# Patient Record
Sex: Female | Born: 1965 | Race: Black or African American | Hispanic: No | Marital: Single | Smoking: Current every day smoker
Health system: Southern US, Community
[De-identification: ages and names within clinical notes are randomized; demographics above are authoritative.]

## PROBLEM LIST (undated history)

## (undated) DIAGNOSIS — I10 Essential (primary) hypertension: Secondary | ICD-10-CM

## (undated) DIAGNOSIS — E559 Vitamin D deficiency, unspecified: Secondary | ICD-10-CM

## (undated) DIAGNOSIS — T7840XA Allergy, unspecified, initial encounter: Secondary | ICD-10-CM

## (undated) DIAGNOSIS — L930 Discoid lupus erythematosus: Secondary | ICD-10-CM

## (undated) HISTORY — DX: Vitamin D deficiency, unspecified: E55.9

## (undated) HISTORY — DX: Allergy, unspecified, initial encounter: T78.40XA

---

## 2005-04-23 ENCOUNTER — Ambulatory Visit: Payer: Self-pay | Admitting: Family Medicine

## 2009-01-12 ENCOUNTER — Ambulatory Visit: Payer: Self-pay | Admitting: Family Medicine

## 2009-03-16 ENCOUNTER — Ambulatory Visit: Payer: Self-pay | Admitting: Family Medicine

## 2009-04-19 ENCOUNTER — Ambulatory Visit: Payer: Self-pay | Admitting: Family Medicine

## 2010-03-01 ENCOUNTER — Ambulatory Visit: Payer: Self-pay | Admitting: Family Medicine

## 2011-01-28 ENCOUNTER — Ambulatory Visit: Payer: Self-pay | Admitting: Family Medicine

## 2012-02-13 ENCOUNTER — Ambulatory Visit: Payer: Self-pay | Admitting: Family Medicine

## 2013-02-15 ENCOUNTER — Ambulatory Visit: Payer: Self-pay | Admitting: Family Medicine

## 2014-02-15 ENCOUNTER — Ambulatory Visit: Payer: Self-pay | Admitting: Family Medicine

## 2015-02-21 ENCOUNTER — Ambulatory Visit
Admit: 2015-02-21 | Disposition: A | Discharge status: Home / Self Care | Payer: Self-pay | Attending: Family Medicine | Admitting: Family Medicine

## 2015-03-16 ENCOUNTER — Other Ambulatory Visit: Payer: Self-pay | Admitting: Family Medicine

## 2015-03-16 DIAGNOSIS — E049 Nontoxic goiter, unspecified: Secondary | ICD-10-CM

## 2015-03-22 ENCOUNTER — Ambulatory Visit
Admission: RE | Admit: 2015-03-22 | Discharge: 2015-03-22 | Disposition: A | Discharge status: Home / Self Care | Payer: BLUE CROSS/BLUE SHIELD | Source: Ambulatory Visit | Attending: Family Medicine | Admitting: Family Medicine

## 2015-03-22 DIAGNOSIS — E049 Nontoxic goiter, unspecified: Secondary | ICD-10-CM

## 2015-03-22 DIAGNOSIS — E042 Nontoxic multinodular goiter: Secondary | ICD-10-CM | POA: Insufficient documentation

## 2015-05-17 ENCOUNTER — Encounter: Payer: Self-pay | Admitting: *Deleted

## 2015-05-18 ENCOUNTER — Ambulatory Visit
Admission: RE | Admit: 2015-05-18 | Discharge: 2015-05-18 | Disposition: A | Discharge status: Home / Self Care | Payer: BLUE CROSS/BLUE SHIELD | Source: Ambulatory Visit | Attending: Gastroenterology | Admitting: Gastroenterology

## 2015-05-18 ENCOUNTER — Encounter
Admission: RE | Disposition: A | Discharge status: Home / Self Care | Payer: Self-pay | Source: Ambulatory Visit | Attending: Gastroenterology

## 2015-05-18 ENCOUNTER — Ambulatory Visit: Payer: BLUE CROSS/BLUE SHIELD | Admitting: Anesthesiology

## 2015-05-18 ENCOUNTER — Encounter: Payer: Self-pay | Admitting: Anesthesiology

## 2015-05-18 DIAGNOSIS — Z823 Family history of stroke: Secondary | ICD-10-CM | POA: Diagnosis not present

## 2015-05-18 DIAGNOSIS — Z8249 Family history of ischemic heart disease and other diseases of the circulatory system: Secondary | ICD-10-CM | POA: Insufficient documentation

## 2015-05-18 DIAGNOSIS — Z882 Allergy status to sulfonamides status: Secondary | ICD-10-CM | POA: Insufficient documentation

## 2015-05-18 DIAGNOSIS — Z6841 Body Mass Index (BMI) 40.0 and over, adult: Secondary | ICD-10-CM | POA: Insufficient documentation

## 2015-05-18 DIAGNOSIS — E669 Obesity, unspecified: Secondary | ICD-10-CM | POA: Diagnosis not present

## 2015-05-18 DIAGNOSIS — F17210 Nicotine dependence, cigarettes, uncomplicated: Secondary | ICD-10-CM | POA: Diagnosis not present

## 2015-05-18 DIAGNOSIS — Z833 Family history of diabetes mellitus: Secondary | ICD-10-CM | POA: Diagnosis not present

## 2015-05-18 DIAGNOSIS — I10 Essential (primary) hypertension: Secondary | ICD-10-CM | POA: Insufficient documentation

## 2015-05-18 DIAGNOSIS — Z1211 Encounter for screening for malignant neoplasm of colon: Secondary | ICD-10-CM | POA: Diagnosis present

## 2015-05-18 DIAGNOSIS — Z79899 Other long term (current) drug therapy: Secondary | ICD-10-CM | POA: Diagnosis not present

## 2015-05-18 DIAGNOSIS — L930 Discoid lupus erythematosus: Secondary | ICD-10-CM | POA: Insufficient documentation

## 2015-05-18 DIAGNOSIS — K640 First degree hemorrhoids: Secondary | ICD-10-CM | POA: Diagnosis not present

## 2015-05-18 HISTORY — DX: Discoid lupus erythematosus: L93.0

## 2015-05-18 HISTORY — DX: Essential (primary) hypertension: I10

## 2015-05-18 HISTORY — PX: COLONOSCOPY WITH PROPOFOL: SHX5780

## 2015-05-18 SURGERY — COLONOSCOPY WITH PROPOFOL
Anesthesia: General

## 2015-05-18 MED ORDER — MIDAZOLAM HCL 2 MG/2ML IJ SOLN
INTRAMUSCULAR | Status: DC | PRN
  Administered 2015-05-18: 2 mg via INTRAVENOUS

## 2015-05-18 MED ORDER — LIDOCAINE HCL (CARDIAC) 20 MG/ML IV SOLN
INTRAVENOUS | Status: DC | PRN
  Administered 2015-05-18: 80 mg via INTRAVENOUS

## 2015-05-18 MED ORDER — SODIUM CHLORIDE 0.9 % IV SOLN: INTRAVENOUS | Status: DC

## 2015-05-18 MED ORDER — SODIUM CHLORIDE 0.9 % IV SOLN
INTRAVENOUS | Status: DC
  Administered 2015-05-18: 1000 mL via INTRAVENOUS

## 2015-05-18 MED ORDER — PROPOFOL INFUSION 10 MG/ML OPTIME
INTRAVENOUS | Status: DC | PRN
  Administered 2015-05-18: 140 ug/kg/min via INTRAVENOUS

## 2015-05-18 MED ORDER — GLYCOPYRROLATE 0.2 MG/ML IJ SOLN
INTRAMUSCULAR | Status: DC | PRN
  Administered 2015-05-18: .2 mg via INTRAVENOUS

## 2015-05-18 NOTE — Anesthesia Postprocedure Evaluation (Signed)
  Anesthesia Post-op Note  Patient: Michele Hart  Procedure(s) Performed: Procedure(s): COLONOSCOPY WITH PROPOFOL (N/A)  Anesthesia type:General  Patient location: PACU  Post pain: Pain level controlled  Post assessment: Post-op Vital signs reviewed, Patient's Cardiovascular Status Stable, Respiratory Function Stable, Patent Airway and No signs of Nausea or vomiting  Post vital signs: Reviewed and stable  Last Vitals:  Filed Vitals:   05/18/15 0951  BP: 125/80  Pulse: 64  Temp:   Resp: 16    Level of consciousness: awake, alert  and patient cooperative  Complications: No apparent anesthesia complications

## 2015-05-18 NOTE — Anesthesia Preprocedure Evaluation (Signed)
Anesthesia Evaluation    Airway Mallampati: II  TM Distance: >3 FB     Dental  (+) Chipped   Pulmonary Current Smoker,          Cardiovascular hypertension,     Neuro/Psych    GI/Hepatic   Endo/Other    Renal/GU      Musculoskeletal   Abdominal   Peds  Hematology   Anesthesia Other Findings Pt. Is sure she is not pregnant. Takes plaquenil for SLE. Obesity.  Reproductive/Obstetrics                             Anesthesia Physical Anesthesia Plan  ASA: III  Anesthesia Plan: General   Post-op Pain Management:    Induction: Intravenous  Airway Management Planned: Nasal Cannula  Additional Equipment:   Intra-op Plan:   Post-operative Plan:   Informed Consent:   Plan Discussed with: CRNA  Anesthesia Plan Comments:         Anesthesia Quick Evaluation

## 2015-05-18 NOTE — Transfer of Care (Signed)
Immediate Anesthesia Transfer of Care Note  Patient: Michele Hart  Procedure(s) Performed: Procedure(s): COLONOSCOPY WITH PROPOFOL (N/A)  Patient Location: PACU and Endoscopy Unit  Anesthesia Type:General  Level of Consciousness: sedated  Airway & Oxygen Therapy: Patient Spontanous Breathing and Patient connected to nasal cannula oxygen  Post-op Assessment: Report given to RN and Post -op Vital signs reviewed and stable  Post vital signs: Reviewed and stable  Last Vitals:  Filed Vitals:   05/18/15 0921  BP: 93/60  Pulse: 77  Temp: 35.7 C  Resp: 16    Complications: No apparent anesthesia complications

## 2015-05-18 NOTE — Discharge Instructions (Signed)

## 2015-05-18 NOTE — H&P (Signed)
  Primary Care Physician:  Jerl MinaHEDRICK, JAMES, MD  Pre-Procedure History & Physical: HPI:  Michele Hart is a 49 y.o. female is here for an colonoscopy.   Past Medical History  Diagnosis Date  . Hypertension   . Discoid lupus     Past Surgical History  Procedure Laterality Date  . Cesarean section      Prior to Admission medications   Medication Sig Start Date End Date Taking? Authorizing Provider  Biotin 1 MG CAPS Take 1 mg by mouth daily.   Yes Historical Provider, MD  Cholecalciferol 1000 UNITS tablet Take 1,000 Units by mouth daily.   Yes Historical Provider, MD  ergocalciferol (VITAMIN D2) 50000 UNITS capsule Take 50,000 Units by mouth once a week.   Yes Historical Provider, MD  hydroxychloroquine (PLAQUENIL) 200 MG tablet Take 200 mg by mouth daily.   Yes Historical Provider, MD  naproxen sodium (ANAPROX) 220 MG tablet Take 220 mg by mouth 2 (two) times daily with a meal.   Yes Historical Provider, MD    Allergies as of 04/27/2015  . (Not on File)    History reviewed. No pertinent family history.  History   Social History  . Marital Status: Single    Spouse Name: N/A  . Number of Children: N/A  . Years of Education: N/A   Occupational History  . Not on file.   Social History Main Topics  . Smoking status: Current Every Day Smoker -- 1.00 packs/day  . Smokeless tobacco: Never Used  . Alcohol Use: No  . Drug Use: No  . Sexual Activity: Not on file   Other Topics Concern  . Not on file   Social History Narrative     Physical Exam: BP 87/68 mmHg  Pulse 65  Temp(Src) 96.5 F (35.8 C) (Tympanic)  Resp 22  Ht 5\' 1"  (1.549 m)  Wt 92.987 kg (205 lb)  BMI 38.75 kg/m2  SpO2 100% General:   Alert,  pleasant and cooperative in NAD Head:  Normocephalic and atraumatic. Neck:  Supple; no masses or thyromegaly. Lungs:  Clear throughout to auscultation.    Heart:  Regular rate and rhythm. Abdomen:  Soft, nontender and nondistended. Normal bowel sounds, without  guarding, and without rebound.   Neurologic:  Alert and  oriented x4;  grossly normal neurologically.  Impression/Plan: Michele Hart is here for an colonoscopy to be performed for screening   Risks, benefits, limitations, and alternatives regarding  colonoscopy have been reviewed with the patient.  Questions have been answered.  All parties agreeable.   Michele Hart, [name] GORDON, MD  05/18/2015, 8:59 AM

## 2015-05-18 NOTE — Op Note (Signed)
[facility] Gastroenterology Patient Name: Michele PianoWillene Hart Procedure Date: 05/18/2015 8:57 AM MRN: [id number] Account #: [id number] Date of Birth: Sep 30, 1966 Admit Type: Outpatient Age: [age] [facility] ROOM 3 Gender: Female Note Status: Finalized Procedure:         Colonoscopy Indications:       Screening for colorectal malignant neoplasm, This is the                     patient's first colonoscopy Patient Profile:   This is a 49 year old female. Providers:         Rhona RaiderMatthew G. Shelle Ironein, MD Referring MD:      Rhona LeavensJames F. Burnett [name], MD (Referring MD) Medicines:         Propofol per Anesthesia Complications:     No immediate complications. Procedure:         Pre-Anesthesia Assessment:                    - Prior to the procedure, a History and Physical was                     performed, and patient medications, allergies and                     sensitivities were reviewed. The patient's tolerance of                     previous anesthesia was reviewed.                    After obtaining informed consent, the colonoscope was                     passed under direct vision. Throughout the procedure, the                     patient's blood pressure, pulse, and oxygen saturations                     were monitored continuously. The Colonoscope was                     introduced through the anus and advanced to the the cecum,                     identified by appendiceal orifice and ileocecal valve. The                     colonoscopy was performed without difficulty. The patient                     tolerated the procedure well. The quality of the bowel                     preparation was excellent. Findings:      The perianal and digital rectal examinations were normal.      Internal hemorrhoids were found during retroflexion. The hemorrhoids       were Grade I (internal hemorrhoids that do not prolapse).      The exam was otherwise without abnormality. Impression:        -  Internal hemorrhoids.                    - The examination was otherwise normal.                    -  No specimens collected. Recommendation:    - Observe patient in GI recovery unit.                    - Resume regular diet.                    - Continue present medications.                    - Repeat colonoscopy in 10 years for screening purposes.                    - Return to referring physician.                    - The findings and recommendations were discussed with the                     patient.                    - The findings and recommendations were discussed with the                     patient's family. Procedure Code(s): --- Professional ---                    [id number], Colonoscopy, flexible; diagnostic, including                     collection of specimen(s) by brushing or washing, when                     performed (separate procedure) CPT copyright 2014 American Medical Association. All rights reserved. The codes documented in this report are preliminary and upon coder review may  be revised to meet current compliance requirements. Kathalene Frames, MD 05/18/2015 9:22:53 AM This report has been signed electronically. Number of Addenda: 0 Note Initiated On: 05/18/2015 8:57 AM Scope Withdrawal Time: 0 hours 10 minutes 44 seconds  Total Procedure Duration: 0 hours 16 minutes 3 seconds       [facility]

## 2015-05-28 ENCOUNTER — Encounter: Payer: Self-pay | Admitting: Gastroenterology

## 2016-02-18 ENCOUNTER — Other Ambulatory Visit: Payer: Self-pay | Admitting: Family Medicine

## 2016-02-18 DIAGNOSIS — Z1231 Encounter for screening mammogram for malignant neoplasm of breast: Secondary | ICD-10-CM

## 2016-02-22 ENCOUNTER — Ambulatory Visit
Admission: RE | Admit: 2016-02-22 | Discharge: 2016-02-22 | Disposition: A | Discharge status: Home / Self Care | Payer: BLUE CROSS/BLUE SHIELD | Source: Ambulatory Visit | Attending: Family Medicine | Admitting: Family Medicine

## 2016-02-22 DIAGNOSIS — Z1231 Encounter for screening mammogram for malignant neoplasm of breast: Secondary | ICD-10-CM | POA: Diagnosis not present

## 2016-10-20 ENCOUNTER — Encounter: Payer: BLUE CROSS/BLUE SHIELD | Attending: Family Medicine | Admitting: Dietician

## 2016-10-20 ENCOUNTER — Encounter: Payer: Self-pay | Admitting: Dietician

## 2016-10-20 VITALS — BP 106/58 | Ht 62.0 in | Wt 198.2 lb

## 2016-10-20 DIAGNOSIS — E119 Type 2 diabetes mellitus without complications: Secondary | ICD-10-CM | POA: Insufficient documentation

## 2016-10-20 DIAGNOSIS — Z6836 Body mass index (BMI) 36.0-36.9, adult: Secondary | ICD-10-CM | POA: Insufficient documentation

## 2016-10-20 DIAGNOSIS — Z713 Dietary counseling and surveillance: Secondary | ICD-10-CM | POA: Diagnosis not present

## 2016-10-20 NOTE — Progress Notes (Signed)
Diabetes Self-Management Education  Visit Type: First/Initial  Appt. Start Time: 1700 Appt. End Time: 1800  10/20/2016  [name], identified by name and date of birth, is a 50 y.o. female with a diagnosis of Diabetes: Type 2.   ASSESSMENT  Blood pressure (!) 106/58, height 5\' 2"  (1.575 m), weight 198 lb 3.2 oz (89.9 kg). Body mass index is 36.25 kg/m.      Diabetes Self-Management Education - 10/20/16 1848      Visit Information   Visit Type First/Initial     Initial Visit   Diabetes Type Type 2     Health Coping   How would you rate your overall health? Good     Psychosocial Assessment   Patient Belief/Attitude about Diabetes Motivated to manage diabetes   Self-care barriers None   Patient Concerns Nutrition/Meal planning;Medication;Monitoring;Healthy Lifestyle;Weight Control;Glycemic Control;Problem Solving  prevent complications; become more fit   Special Needs None   Preferred Learning Style Hands on   Learning Readiness Ready   What is the last grade level you completed in school? 12 + associate degree     Pre-Education Assessment   Patient understands the diabetes disease and treatment process. Needs Instruction   Patient understands incorporating nutritional management into lifestyle. Needs Instruction   Patient undertands incorporating physical activity into lifestyle. Needs Instruction   Patient understands using medications safely. Needs Instruction   Patient understands monitoring blood glucose, interpreting and using results Needs Instruction  has Ultra One Touch 2 meter but does not know how to use   Patient understands prevention, detection, and treatment of acute complications. Needs Instruction   Patient understands prevention, detection, and treatment of chronic complications. Needs Instruction   Patient understands how to develop strategies to address psychosocial issues. Needs Instruction   Patient understands how to develop strategies to  promote health/change behavior. Needs Instruction     Complications   Last HgB A1C per patient/outside source 11.4 %  09-23-16   How often do you check your blood sugar? 0 times/day (not testing)   Have you had a dilated eye exam in the past 12 months? Yes  6 months ago   Have you had a dental exam in the past 12 months? Yes  2 months ago   Are you checking your feet? No     Dietary Intake   Breakfast --  skips   Snack (morning) --  occasional snack 9a=chips   Lunch --  eats lunch at 12p=usually eats salad with chicken   Snack (afternoon) --  none   Dinner --  eats supper at 6-7p=eats fried foods daily   Snack (evening) --  none   Beverage(s) --  drinks water 4-5x/day and sugar free beverages 4-5x/day     Exercise   Exercise Type ADL's  no regular exercise     Patient Education   Previous Diabetes Education No   Disease state  Definition of diabetes, type 1 and 2, and the diagnosis of diabetes   Nutrition management  Role of diet in the treatment of diabetes and the relationship between the three main macronutrients and blood glucose level;Food label reading, portion sizes and measuring food.;Carbohydrate counting   Physical activity and exercise  Role of exercise on diabetes management, blood pressure control and cardiac health.;Helped patient identify appropriate exercises in relation to his/her diabetes, diabetes complications and other health issue.   Medications Reviewed patients medication for diabetes, action, purpose, timing of dose and side effects.   Monitoring Taught/evaluated SMBG meter.;Purpose  and frequency of SMBG.;Taught/discussed recording of test results and interpretation of SMBG.;Identified appropriate SMBG and/or A1C goals.;Yearly dilated eye exam  instructed on use of her Ultra One Touch 2 meter; BG 108 (2 hr pp)   Chronic complications Relationship between chronic complications and blood glucose control;Lipid levels, blood glucose control and heart  disease;Dental care   Psychosocial adjustment Role of stress on diabetes   Personal strategies to promote health Lifestyle issues that need to be addressed for better diabetes care;Helped patient develop diabetes management plan for (enter comment)      Individualized Plan for Diabetes Self-Management Training:   Learning Objective:  Patient will have a greater understanding of diabetes self-management. Patient education plan is to attend individual and/or group sessions per assessed needs and concerns.   Plan:   Patient Instructions   Check blood sugars 2 x day before breakfast and 2 hrs after supper every day and record Exercise:  Walk  for    15-20  minutes   At least 3  days a week Avoid sugar sweetened drinks (soda, tea, coffee, sports drinks, juices) Avoid fried foods Make healthy food choices Eat 3 meals day,  1-2  snacks a day at bedtime and after noon if needed Eat 2-3 carbohydrate servings/meal + protein Eat 1 carbohydrate serving/snack + protein Space meals 4-6 hours apart Bring blood sugar records to the next appointment/class Call your doctor for a prescription for:  1. Meter strips (type)  Ultra One Touch test strips  checking  2 times per day  2. Lancets (type) One Touch Delica lancets  checking  2   times per day Get a Sharps container Return for appointment/classes on: 11-10-16   Expected Outcomes:   positive  Education material provided: General meal guidelines  If problems or questions, patient to contact team via:  [phone]  Future DSME appointment:  11-10-16

## 2016-10-20 NOTE — Patient Instructions (Signed)
  Check blood sugars 2 x day before breakfast and 2 hrs after supper every day and record Exercise:  Walk  for    15-20  minutes   At least 3  days a week Avoid sugar sweetened drinks (soda, tea, coffee, sports drinks, juices) Avoid fried foods Make healthy food choices Eat 3 meals day,  1-2  snacks a day at bedtime and after noon if needed Eat 2-3 carbohydrate servings/meal + protein Eat 1 carbohydrate serving/snack + protein Space meals 4-6 hours apart Bring blood sugar records to the next appointment/class Call your doctor for a prescription for:  1. Meter strips (type)  Ultra One Touch test strips  checking  2 times per day  2. Lancets (type) One Touch Delica lancets  checking  2   times per day Get a Sharps container Return for appointment/classes on: 11-10-16

## 2016-11-10 ENCOUNTER — Encounter: Payer: BLUE CROSS/BLUE SHIELD | Attending: Family Medicine | Admitting: Dietician

## 2016-11-10 ENCOUNTER — Encounter: Payer: Self-pay | Admitting: Dietician

## 2016-11-10 VITALS — Ht 62.0 in | Wt 198.3 lb

## 2016-11-10 DIAGNOSIS — Z6836 Body mass index (BMI) 36.0-36.9, adult: Secondary | ICD-10-CM | POA: Diagnosis not present

## 2016-11-10 DIAGNOSIS — Z713 Dietary counseling and surveillance: Secondary | ICD-10-CM | POA: Diagnosis not present

## 2016-11-10 DIAGNOSIS — E119 Type 2 diabetes mellitus without complications: Secondary | ICD-10-CM | POA: Diagnosis present

## 2016-11-10 NOTE — Progress Notes (Signed)

## 2016-11-17 ENCOUNTER — Encounter: Payer: BLUE CROSS/BLUE SHIELD | Admitting: Dietician

## 2016-11-17 ENCOUNTER — Encounter: Payer: Self-pay | Admitting: Dietician

## 2016-11-17 VITALS — Wt 199.2 lb

## 2016-11-17 DIAGNOSIS — E119 Type 2 diabetes mellitus without complications: Secondary | ICD-10-CM

## 2016-11-17 NOTE — Progress Notes (Signed)

## 2016-11-24 ENCOUNTER — Encounter: Payer: BLUE CROSS/BLUE SHIELD | Admitting: Dietician

## 2016-11-24 ENCOUNTER — Encounter: Payer: Self-pay | Admitting: Dietician

## 2016-11-24 VITALS — BP 118/70 | Ht 62.0 in | Wt 197.8 lb

## 2016-11-24 DIAGNOSIS — E119 Type 2 diabetes mellitus without complications: Secondary | ICD-10-CM

## 2016-11-24 NOTE — Progress Notes (Signed)

## 2016-12-16 ENCOUNTER — Encounter: Payer: Self-pay | Admitting: Dietician

## 2019-04-21 ENCOUNTER — Other Ambulatory Visit: Payer: Self-pay | Admitting: Family Medicine

## 2019-04-21 DIAGNOSIS — Z1231 Encounter for screening mammogram for malignant neoplasm of breast: Secondary | ICD-10-CM

## 2019-05-31 ENCOUNTER — Ambulatory Visit
Admission: RE | Admit: 2019-05-31 | Discharge: 2019-05-31 | Disposition: A | Discharge status: Home / Self Care | Payer: BC Managed Care – PPO | Source: Ambulatory Visit | Attending: Family Medicine | Admitting: Family Medicine

## 2019-05-31 DIAGNOSIS — Z1231 Encounter for screening mammogram for malignant neoplasm of breast: Secondary | ICD-10-CM | POA: Diagnosis present

## 2020-01-26 ENCOUNTER — Ambulatory Visit: Payer: Self-pay | Attending: Internal Medicine

## 2020-02-06 ENCOUNTER — Other Ambulatory Visit: Payer: Self-pay | Admitting: Family Medicine

## 2020-02-06 DIAGNOSIS — Z1231 Encounter for screening mammogram for malignant neoplasm of breast: Secondary | ICD-10-CM

## 2020-02-21 ENCOUNTER — Ambulatory Visit: Payer: Self-pay

## 2020-10-30 ENCOUNTER — Other Ambulatory Visit: Payer: Self-pay

## 2020-10-30 ENCOUNTER — Ambulatory Visit
Admission: RE | Admit: 2020-10-30 | Discharge: 2020-10-30 | Disposition: A | Discharge status: Home / Self Care | Payer: BC Managed Care – PPO | Source: Ambulatory Visit | Attending: Family Medicine | Admitting: Family Medicine

## 2020-10-30 DIAGNOSIS — Z1231 Encounter for screening mammogram for malignant neoplasm of breast: Secondary | ICD-10-CM | POA: Insufficient documentation

## 2021-11-27 ENCOUNTER — Ambulatory Visit
Admission: RE | Admit: 2021-11-27 | Discharge: 2021-11-27 | Disposition: A | Discharge status: Home / Self Care | Payer: BC Managed Care – PPO | Source: Ambulatory Visit | Attending: Family Medicine | Admitting: Family Medicine

## 2021-11-27 ENCOUNTER — Other Ambulatory Visit: Payer: Self-pay | Admitting: Family Medicine

## 2021-11-27 ENCOUNTER — Other Ambulatory Visit: Payer: Self-pay

## 2021-11-27 DIAGNOSIS — Z1231 Encounter for screening mammogram for malignant neoplasm of breast: Secondary | ICD-10-CM

## 2022-09-23 IMAGING — MG MM DIGITAL SCREENING BILAT W/ TOMO AND CAD
8 series · 8 of 24 positions shown · non-contrast
Comparison: Previous exam(s).

CLINICAL DATA: Screening.

EXAM:
DIGITAL SCREENING BILATERAL MAMMOGRAM WITH TOMOSYNTHESIS AND CAD
TECHNIQUE: Bilateral screening digital craniocaudal and mediolateral oblique
mammograms were obtained. Bilateral screening digital breast
tomosynthesis was performed. The images were evaluated with
computer-aided detection.

[R CC synth-2D]
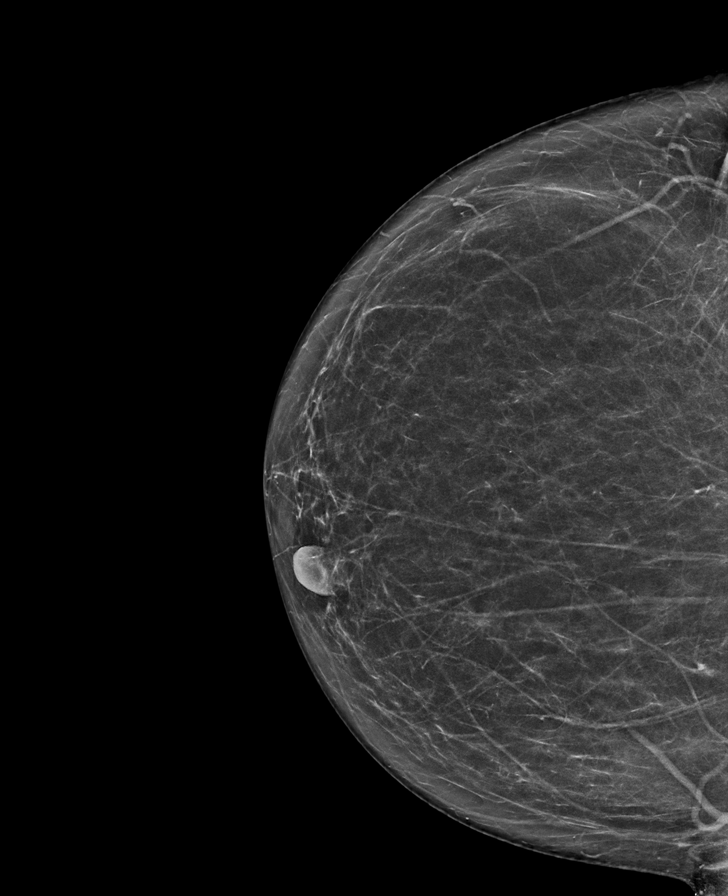

[L CC synth-2D]
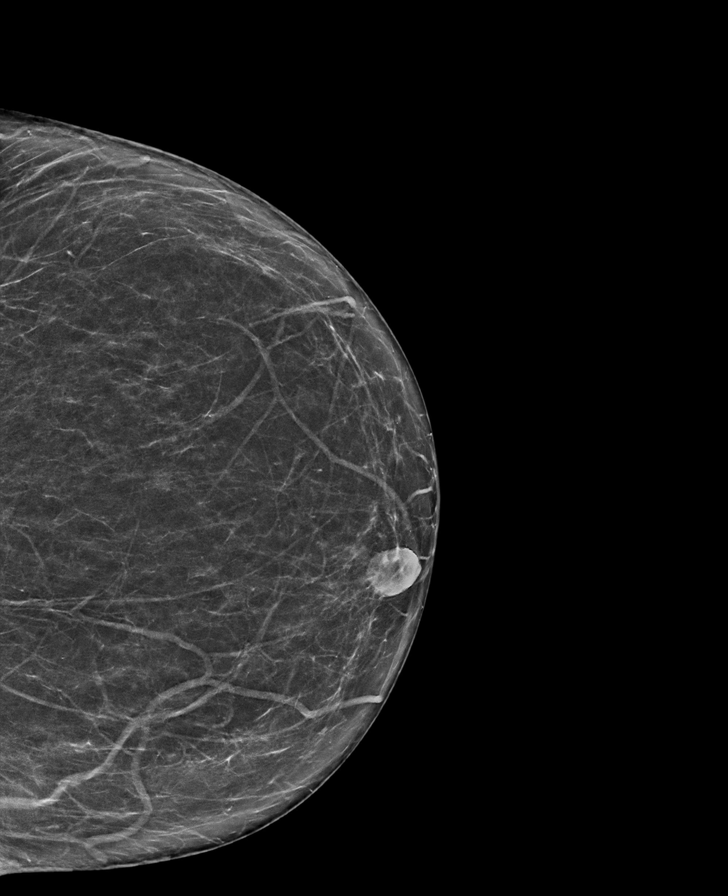

[R MLO synth-2D]
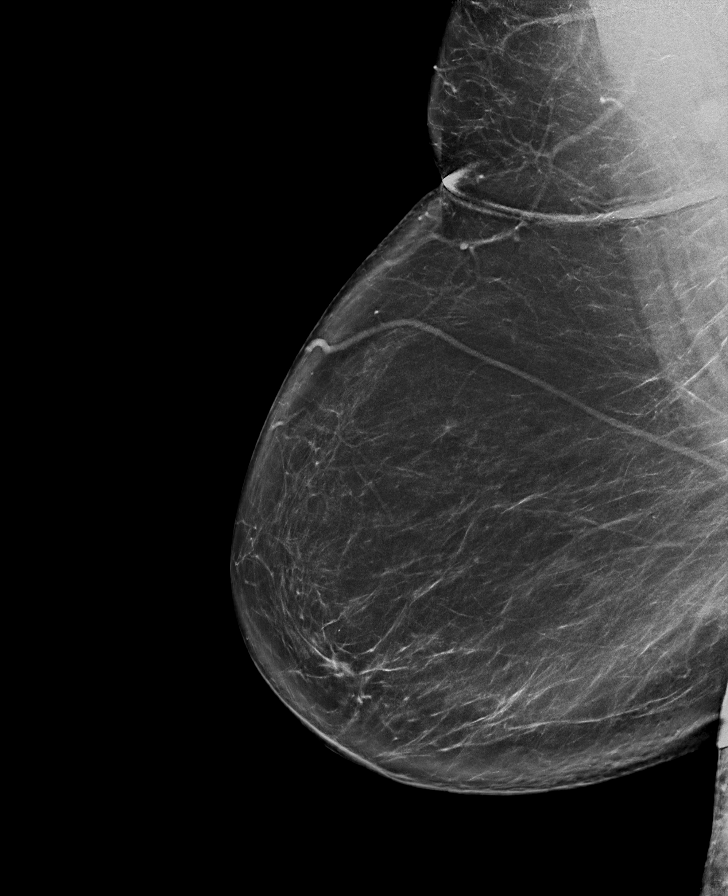

[L MLO synth-2D]
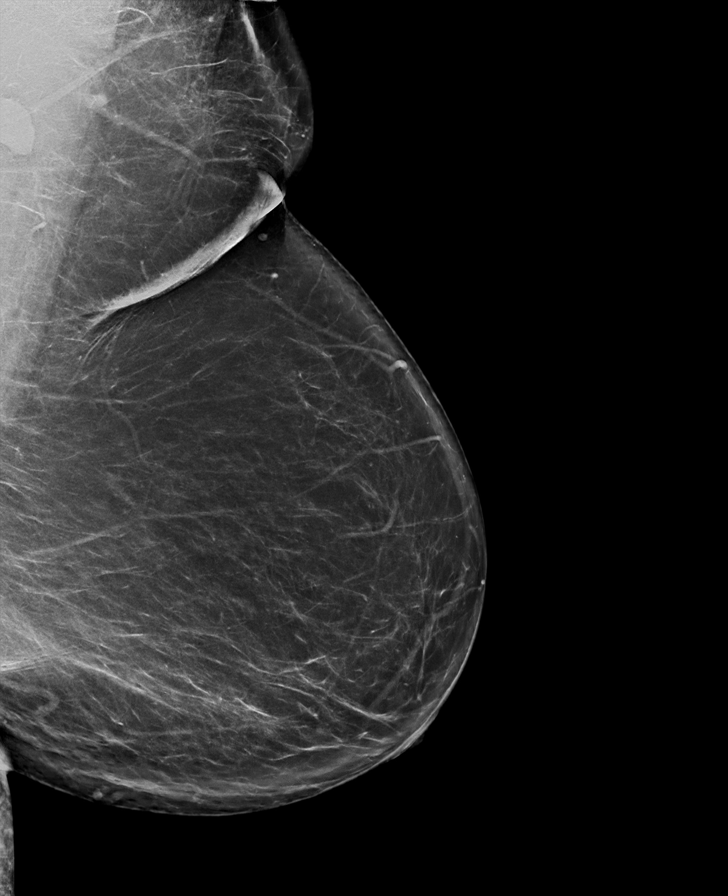

[R CC tomo · tomo slice 36/71.0]
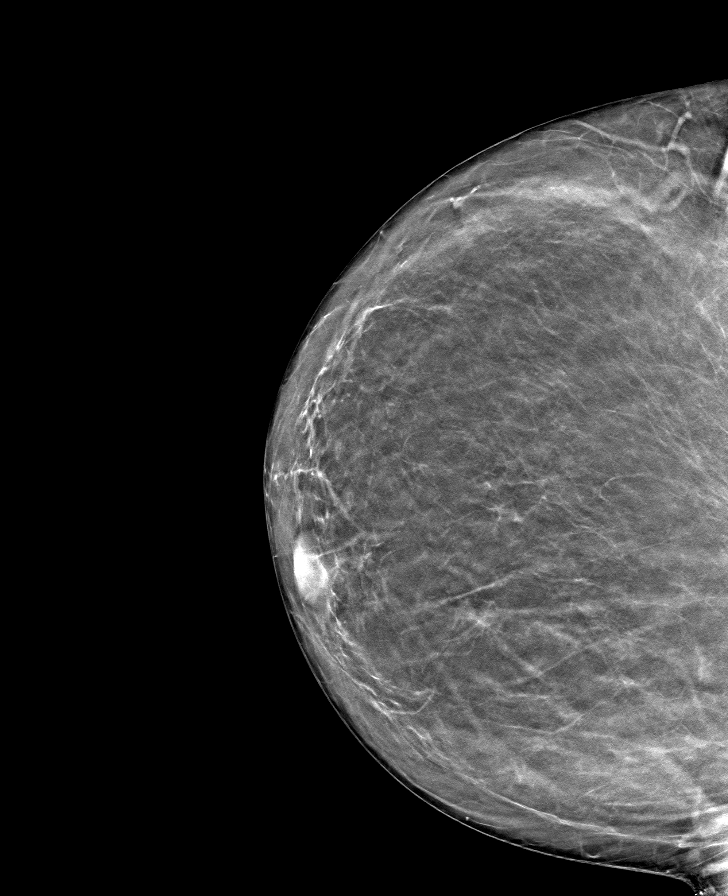

[R MLO tomo · tomo slice 47/94.0]
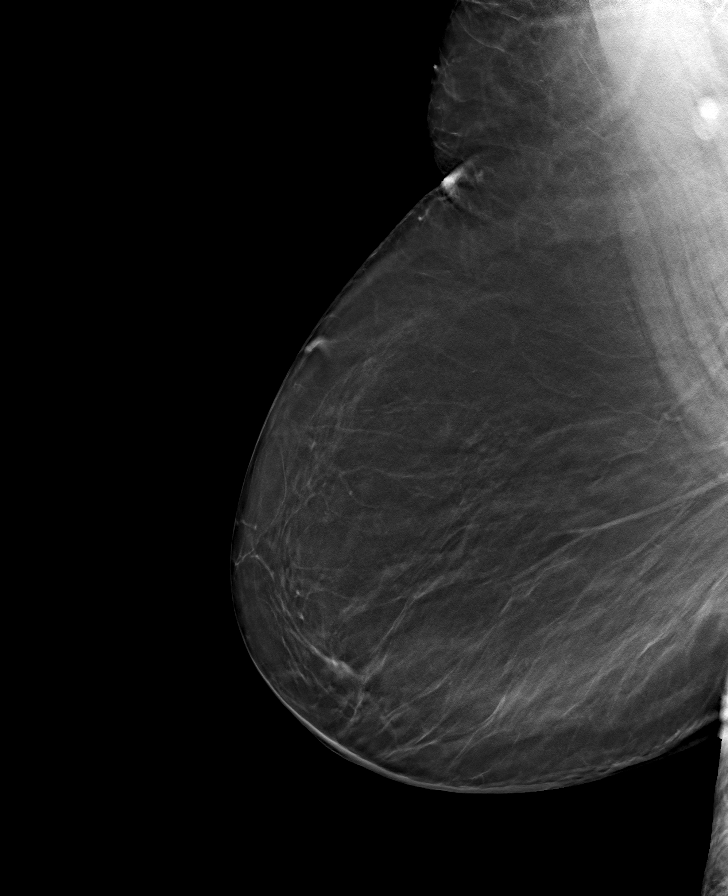

[L CC tomo · tomo slice 37/73.0]
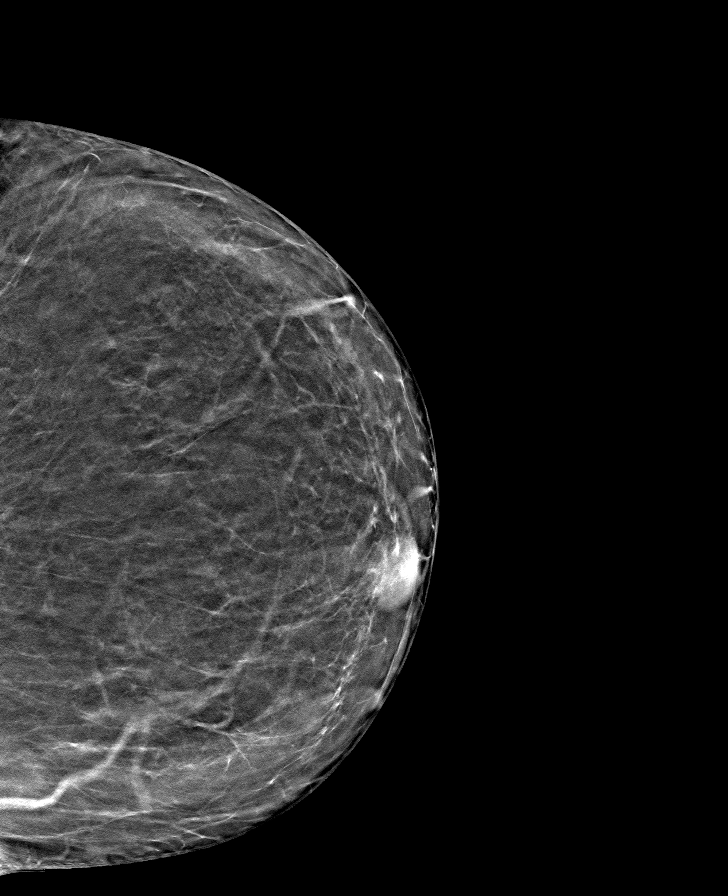

[L MLO tomo · tomo slice 46/91.0]
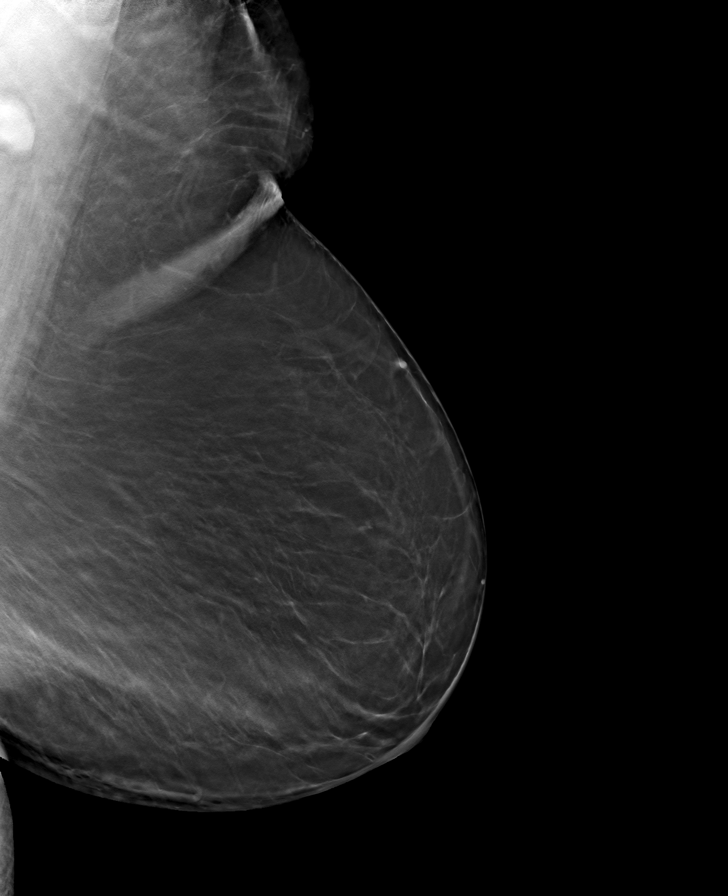

[8 of 24 positions shown; findings below may reference images not displayed]

ACR Breast Density Category b: There are scattered areas of
fibroglandular density.
FINDINGS: There are no findings suspicious for malignancy.
IMPRESSION: No mammographic evidence of malignancy. A result letter of this
screening mammogram will be mailed directly to the patient.

RECOMMENDATION:
Screening mammogram in one year. (Code:51-O-LD2)

BI-RADS CATEGORY  1: Negative.

## 2022-11-26 ENCOUNTER — Other Ambulatory Visit: Payer: Self-pay | Admitting: Family Medicine

## 2022-11-26 DIAGNOSIS — Z1231 Encounter for screening mammogram for malignant neoplasm of breast: Secondary | ICD-10-CM

## 2022-12-03 ENCOUNTER — Ambulatory Visit
Admission: RE | Admit: 2022-12-03 | Discharge: 2022-12-03 | Disposition: A | Discharge status: Home / Self Care | Payer: BC Managed Care – PPO | Source: Ambulatory Visit | Attending: Family Medicine | Admitting: Family Medicine

## 2022-12-03 DIAGNOSIS — Z1231 Encounter for screening mammogram for malignant neoplasm of breast: Secondary | ICD-10-CM

## 2023-12-25 ENCOUNTER — Other Ambulatory Visit: Payer: Self-pay | Admitting: Family Medicine

## 2023-12-25 DIAGNOSIS — Z1231 Encounter for screening mammogram for malignant neoplasm of breast: Secondary | ICD-10-CM

## 2024-01-04 ENCOUNTER — Ambulatory Visit
Admission: RE | Admit: 2024-01-04 | Discharge: 2024-01-04 | Disposition: A | Discharge status: Home / Self Care | Payer: BC Managed Care – PPO | Source: Ambulatory Visit | Attending: Family Medicine | Admitting: Family Medicine

## 2024-01-04 DIAGNOSIS — Z1231 Encounter for screening mammogram for malignant neoplasm of breast: Secondary | ICD-10-CM

## 2024-04-20 ENCOUNTER — Other Ambulatory Visit: Payer: Self-pay

## 2024-04-20 ENCOUNTER — Emergency Department
Admission: EM | Admit: 2024-04-20 | Discharge: 2024-04-20 | Disposition: A | Discharge status: Home / Self Care | Attending: Emergency Medicine | Admitting: Emergency Medicine

## 2024-04-20 ENCOUNTER — Emergency Department

## 2024-04-20 DIAGNOSIS — R109 Unspecified abdominal pain: Secondary | ICD-10-CM | POA: Diagnosis present

## 2024-04-20 DIAGNOSIS — D72829 Elevated white blood cell count, unspecified: Secondary | ICD-10-CM | POA: Insufficient documentation

## 2024-04-20 DIAGNOSIS — I10 Essential (primary) hypertension: Secondary | ICD-10-CM | POA: Diagnosis not present

## 2024-04-20 DIAGNOSIS — R739 Hyperglycemia, unspecified: Secondary | ICD-10-CM | POA: Insufficient documentation

## 2024-04-20 LAB — CBC
HCT: 40.9 % (ref 36.0–46.0)
Hemoglobin: 13.5 g/dL (ref 12.0–15.0)
MCH: 30.1 pg (ref 26.0–34.0)
MCHC: 33 g/dL (ref 30.0–36.0)
MCV: 91.1 fL (ref 80.0–100.0)
Platelets: 339 10*3/uL (ref 150–400)
RBC: 4.49 MIL/uL (ref 3.87–5.11)
RDW: 13.6 % (ref 11.5–15.5)
WBC: 13.7 10*3/uL — ABNORMAL HIGH (ref 4.0–10.5)
nRBC: 0 % (ref 0.0–0.2)

## 2024-04-20 LAB — BASIC METABOLIC PANEL WITH GFR
Anion gap: 12 (ref 5–15)
BUN: 9 mg/dL (ref 6–20)
CO2: 25 mmol/L (ref 22–32)
Calcium: 9.2 mg/dL (ref 8.9–10.3)
Chloride: 104 mmol/L (ref 98–111)
Creatinine, Ser: 0.65 mg/dL (ref 0.44–1.00)
GFR, Estimated: >60 mL/min (ref >60)
Glucose, Bld: 139 mg/dL — ABNORMAL HIGH (ref 70–99)
Potassium: 3.8 mmol/L (ref 3.5–5.1)
Sodium: 141 mmol/L (ref 135–145)

## 2024-04-20 LAB — URINALYSIS, ROUTINE W REFLEX MICROSCOPIC
Bacteria, UA: NONE SEEN
Bilirubin Urine: NEGATIVE
Glucose, UA: NEGATIVE mg/dL
Ketones, ur: NEGATIVE mg/dL
Leukocytes,Ua: NEGATIVE
Nitrite: NEGATIVE
Protein, ur: NEGATIVE mg/dL
Specific Gravity, Urine: 1.009 (ref 1.005–1.030)
pH: 5 (ref 5.0–8.0)

## 2024-04-20 LAB — HEPATIC FUNCTION PANEL
ALT: 19 U/L (ref 0–44)
AST: 18 U/L (ref 15–41)
Albumin: 4.1 g/dL (ref 3.5–5.0)
Alkaline Phosphatase: 93 U/L (ref 38–126)
Bilirubin, Direct: 0.2 mg/dL (ref 0.0–0.2)
Indirect Bilirubin: 0.5 mg/dL (ref 0.3–0.9)
Total Bilirubin: 0.7 mg/dL (ref 0.0–1.2)
Total Protein: 7.7 g/dL (ref 6.5–8.1)

## 2024-04-20 LAB — LIPASE, BLOOD: Lipase: 28 U/L (ref 11–51)

## 2024-04-20 MED ORDER — HYDROCODONE-ACETAMINOPHEN 5-325 MG PO TABS: 1.0000 | ORAL_TABLET | Freq: Four times a day (QID) | ORAL | 0 refills | Status: AC | PRN

## 2024-04-20 MED ORDER — IBUPROFEN 800 MG PO TABS: 800.0000 mg | ORAL_TABLET | Freq: Three times a day (TID) | ORAL | 2 refills | Status: AC | PRN

## 2024-04-20 MED ORDER — ACETAMINOPHEN 325 MG PO TABS: 650.0000 mg | ORAL_TABLET | Freq: Four times a day (QID) | ORAL | 2 refills | Status: AC | PRN

## 2024-04-20 MED ORDER — LIDOCAINE 5 % EX PTCH: 1.0000 | MEDICATED_PATCH | CUTANEOUS | 0 refills | Status: AC

## 2024-04-20 MED ORDER — IBUPROFEN 600 MG PO TABS
600.0000 mg | ORAL_TABLET | Freq: Once | ORAL | Status: AC
  Administered 2024-04-20: 600 mg via ORAL
  Filled 2024-04-20: qty 1

## 2024-04-20 MED ORDER — LIDOCAINE 5 % EX PTCH
1.0000 | MEDICATED_PATCH | CUTANEOUS | Status: DC
  Administered 2024-04-20: 1 via TRANSDERMAL
  Filled 2024-04-20: qty 1

## 2024-04-20 NOTE — ED Triage Notes (Signed)
 Pt here with right flank and back pain x2 days. Pt states the pain has been constant but if she moves a certain way it is worse. Pt denies NVD.

## 2024-04-20 NOTE — Discharge Instructions (Addendum)
 Your lab work and imaging was reassuring today.  I believe your pain is due to a pulled muscles.  You can take 650 mg of Tylenol and 600 mg of ibuprofen every 6 hours as needed for pain. You can use ice, heat, muscle creams and other topical pain relievers as well.  The lidocaine  patches can be applied every 12 hours.  Put it on the spot that hurts the most.  I have sent a strong pain medication called Norco to the pharmacy.  This medication can be taken every 4-6 hours as needed for severe or breakthrough pain.  This medication can cause dependency so only take if you are unable to control your pain with other medications.  It will make you sleepy so do not drive or operate heavy machinery after taking it.  Do not drink alcohol while taking this medication.  This medication can also cause constipation so please take an over-the-counter stool softener like MiraLAX or Colace while taking it.  This medication contains both hydrocodone and acetaminophen.  Do not take Tylenol at the same time.  You can take the Norco or Tylenol but not both.

## 2024-04-20 NOTE — ED Provider Notes (Signed)
 [facility] Provider Note    Event Date/Time   First MD Initiated Contact with Patient 04/20/24 1332     (approximate)   History   Flank Pain and Back Pain   HPI  Michele Hart is a 58 y.o. female with PMH of hypertension, discoid lupus and vitamin D deficiency presents for evaluation of right sided back and flank pain for 2 days.  Patient states that she feels the pain all the time but it is worse with specific movements.  She did not have any falls or trauma, denies any specific injury.  She has not had any urinary symptoms.  No nausea, vomiting or diarrhea.      Physical Exam   Triage Vital Signs: ED Triage Vitals  Encounter Vitals Group     BP 04/20/24 1235 (!) 137/57     Girls Systolic BP Percentile --      Girls Diastolic BP Percentile --      Boys Systolic BP Percentile --      Boys Diastolic BP Percentile --      Pulse Rate 04/20/24 1235 82     Resp 04/20/24 1235 18     Temp 04/20/24 1235 98.5 F (36.9 C)     Temp Source 04/20/24 1235 Oral     SpO2 04/20/24 1235 97 %     Weight 04/20/24 1236 197 lb 12 oz (89.7 kg)     Height 04/20/24 1236 5' 2 (1.575 m)     Head Circumference --      Peak Flow --      Pain Score 04/20/24 1235 10     Pain Loc --      Pain Education --      Exclude from Growth Chart --     Most recent vital signs: Vitals:   04/20/24 1235 04/20/24 1838  BP: (!) 137/57 (!) 156/66  Pulse: 82 65  Resp: 18 16  Temp: 98.5 F (36.9 C) 98.5 F (36.9 C)  SpO2: 97% 100%   General: Awake, no distress.  CV:  Good peripheral perfusion.  RRR. Resp:  Normal effort.  CTAB. Abd:  No distention.  Soft, nontender. Other:  Right-sided CVA tenderness and right lower paraspinal muscle tenderness, no tenderness to palpation over the vertebrae.  Tender to palpation in the right flank.     ED Results / Procedures / Treatments   Labs (all labs ordered are listed, but only abnormal results are displayed) Labs Reviewed   URINALYSIS, ROUTINE W REFLEX MICROSCOPIC - Abnormal; Notable for the following components:      Result Value   Color, Urine STRAW (*)    APPearance CLEAR (*)    Hgb urine dipstick SMALL (*)    All other components within normal limits  BASIC METABOLIC PANEL WITH GFR - Abnormal; Notable for the following components:   Glucose, Bld 139 (*)    All other components within normal limits  CBC - Abnormal; Notable for the following components:   WBC 13.7 (*)    All other components within normal limits  HEPATIC FUNCTION PANEL  LIPASE, BLOOD    RADIOLOGY  CT renal stone study obtained, interpreted the images as well as reviewed the radiologist report which showed nonobstructing left lower pole renal calculus. No hydronephrosis.   PROCEDURES:  Critical Care performed: No  Procedures   MEDICATIONS ORDERED IN ED: Medications  ibuprofen (ADVIL) tablet 600 mg (has no administration in time range)  lidocaine  (LIDODERM ) 5 % 1 patch (  has no administration in time range)     IMPRESSION / MDM / ASSESSMENT AND PLAN / ED COURSE  I reviewed the triage vital signs and the nursing notes.                             58 year old female presents for evaluation of right-sided flank and back pain for 2 days.  Blood pressure is little bit elevated but patient does have history of hypertension.  Vital signs are stable. Patient comfortable as long as she is not moving.   Differential diagnosis includes, but is not limited to, UTI, nephrolithiasis, pyelonephritis, biliary disease, pancreatitis, muscle strain.  Patient's presentation is most consistent with acute complicated illness / injury requiring diagnostic workup.  BMP shows elevated glucose otherwise unremarkable.  CBC shows mild leukocytosis but is otherwise normal.  Urinalysis normal aside from small amount of hemoglobin.  Given location of patient's pain and the hemoglobin in the urine we will obtain CT renal stone study to rule out  nephrolithiasis.  No signs of infection in the urine so low suspicion for pyelonephritis and UTI.  Patient is tender in the right flank so did consider right upper quadrant ultrasound but given the hemoglobin in the urine we will start with a CT renal stone study as this would image both the kidneys and the gallbladder.  Patient was given ibuprofen and lidocaine  patch for pain while in the ED.   CT renal stone study showed a nonobstructing 1 cm calculus.  Do not feel that this explains patient's pain, we will proceed with getting a right upper quadrant ultrasound to rule out cholecystitis as patient has RUQ tenderness.  Also adding hepatic function panel and lipase.  Did consider ACS and adding troponin and EKG but patient does not have any chest pain or shortness of breath.  The pain is not exacerbated with activity or worse with deep breaths.  Hepatic function panel and lipase all within normal limits.  Right upper quadrant ultrasound is negative, no gallstones, gallbladder wall thickening or ductal dilation.  Given reassuring workup I feel that patient's pain is musculoskeletal.  Recommended multimodal pain management using Tylenol, ibuprofen, ice, heat and lidocaine  patches.  Will also send patient a few stronger pain pills.  She was given a note for work.  We discussed return precautions and when to follow-up.  She voiced understanding, all questions were answered and she was stable at discharge.     FINAL CLINICAL IMPRESSION(S) / ED DIAGNOSES   Final diagnoses:  Right flank pain     Rx / DC Orders   ED Discharge Orders          Ordered    ibuprofen (ADVIL) 800 MG tablet  Every 8 hours PRN        04/20/24 1840    acetaminophen (TYLENOL) 325 MG tablet  Every 6 hours PRN        04/20/24 1840    HYDROcodone-acetaminophen (NORCO/VICODIN) 5-325 MG tablet  Every 6 hours PRN        04/20/24 1840    lidocaine  (LIDODERM ) 5 %  Every 24 hours        04/20/24 1840             Note:   This document was prepared using Dragon voice recognition software and may include unintentional dictation errors.   Phyliss Breen, PA-C 04/20/24 1842    Lubertha Rush, MD 04/21/24 [phone]
